# Patient Record
Sex: Female | Born: 1980 | Race: White | Hispanic: No | Marital: Married | Smoking: Former smoker
Health system: Southern US, Community
[De-identification: ages and names within clinical notes are randomized; demographics above are authoritative.]

## PROBLEM LIST (undated history)

## (undated) DIAGNOSIS — F32A Depression, unspecified: Secondary | ICD-10-CM

## (undated) DIAGNOSIS — F419 Anxiety disorder, unspecified: Secondary | ICD-10-CM

## (undated) DIAGNOSIS — T7840XA Allergy, unspecified, initial encounter: Secondary | ICD-10-CM

## (undated) DIAGNOSIS — J45909 Unspecified asthma, uncomplicated: Secondary | ICD-10-CM

## (undated) DIAGNOSIS — F329 Major depressive disorder, single episode, unspecified: Secondary | ICD-10-CM

## (undated) HISTORY — DX: Allergy, unspecified, initial encounter: T78.40XA

## (undated) HISTORY — DX: Unspecified asthma, uncomplicated: J45.909

## (undated) HISTORY — DX: Anxiety disorder, unspecified: F41.9

## (undated) HISTORY — DX: Major depressive disorder, single episode, unspecified: F32.9

## (undated) HISTORY — DX: Depression, unspecified: F32.A

---

## 2011-11-18 ENCOUNTER — Ambulatory Visit (INDEPENDENT_AMBULATORY_CARE_PROVIDER_SITE_OTHER): Payer: BC Managed Care – PPO

## 2011-11-18 DIAGNOSIS — F411 Generalized anxiety disorder: Secondary | ICD-10-CM

## 2011-11-18 DIAGNOSIS — Z23 Encounter for immunization: Secondary | ICD-10-CM

## 2011-11-18 DIAGNOSIS — S61209A Unspecified open wound of unspecified finger without damage to nail, initial encounter: Secondary | ICD-10-CM

## 2011-11-26 ENCOUNTER — Encounter (INDEPENDENT_AMBULATORY_CARE_PROVIDER_SITE_OTHER): Payer: BC Managed Care – PPO

## 2011-11-26 DIAGNOSIS — S61209A Unspecified open wound of unspecified finger without damage to nail, initial encounter: Secondary | ICD-10-CM

## 2015-05-13 ENCOUNTER — Ambulatory Visit (INDEPENDENT_AMBULATORY_CARE_PROVIDER_SITE_OTHER): Payer: BC Managed Care – PPO | Admitting: Physician Assistant

## 2015-05-13 VITALS — BP 120/80 | HR 84 | Temp 98.2°F | Resp 18 | Ht 65.25 in | Wt 196.5 lb

## 2015-05-13 DIAGNOSIS — J029 Acute pharyngitis, unspecified: Secondary | ICD-10-CM

## 2015-05-13 DIAGNOSIS — R0981 Nasal congestion: Secondary | ICD-10-CM | POA: Diagnosis not present

## 2015-05-13 MED ORDER — AZITHROMYCIN 250 MG PO TABS: ORAL_TABLET | ORAL | Status: DC

## 2015-05-13 NOTE — Patient Instructions (Signed)
Continue taking the sudafed and claritin as you've been.  For the sore throat, cycling between tylenol and ibuprofen every 4 hours will help. Lozenges may help.  Please take the azithro 2 tabs today, then one daily for 4 days.

## 2015-05-13 NOTE — Progress Notes (Signed)
   Subjective:    Patient ID: Mary Aguilar, female    DOB: 08/19/1981, 34 y.o.   MRN: [id number]  Chief Complaint  Patient presents with  . Sore Throat    x 5-7 days  . Cough  . Headache  . Adenopathy   Medications, allergies, past medical history, surgical history, family history, social history and problem list reviewed and updated.  HPI  [age] yof presents with above sx.   Sx started 7-8 days ago with head/nasal congestion. Started with st couple days later. Sx improved after few days but got worse again 2 days ago. Has been having mild cough past few days non prod. Low grade fevers at home per pt. She typically runs in 97s but has been running slightly higher at home. Feels neck LNs are swollen.   Taking claritin and sudafed daily with no relief.   Hx allergy to pcn was hives.   Review of Systems See HPI.     Objective:   Physical Exam  Constitutional:  BP 120/80 mmHg  Pulse 84  Temp(Src) 98.2 F (36.8 C) (Oral)  Resp 18  Ht 5' 5.25" (1.657 m)  Wt 196 lb 8 oz (89.132 kg)  BMI 32.46 kg/m2  SpO2 99%  LMP 05/12/2015 (Exact Date)   HENT:  Right Ear: A middle ear effusion is present.  Left Ear: A middle ear effusion is present.  Nose: No mucosal edema or rhinorrhea. Right sinus exhibits maxillary sinus tenderness. Right sinus exhibits no frontal sinus tenderness. Left sinus exhibits maxillary sinus tenderness. Left sinus exhibits no frontal sinus tenderness.  Mouth/Throat: Uvula is midline, oropharynx is clear and moist and mucous membranes are normal.  Mild bilateral sinus ttp  Pulmonary/Chest: Effort normal and breath sounds normal. No tachypnea.  Lymphadenopathy:       Head (right side): No submental, no submandibular and no tonsillar adenopathy present.       Head (left side): No submental, no submandibular and no tonsillar adenopathy present.    She has no cervical adenopathy.      Assessment & Plan:   Sore throat - Plan: azithromycin (ZITHROMAX) 250 MG  tablet  Sinus congestion - Plan: azithromycin (ZITHROMAX) 250 MG tablet --sx ongoing 7-8 days with slight worsening 2 days ago, low grade fevers at home --pt asking for zpack as heading to beach tomorrow for 9 days, given --continue claritin/sudafed, tylenol/ibuprofen cycling for st, lozenges for st --see someone at beach if no improvement 4-5 days  Donnajean Lopesodd M. Vernal Hritz, PA-C Physician Assistant-Certified [facility]  05/13/2015 8:44 PM

## 2015-07-30 ENCOUNTER — Ambulatory Visit (INDEPENDENT_AMBULATORY_CARE_PROVIDER_SITE_OTHER): Payer: BC Managed Care – PPO | Admitting: Family Medicine

## 2015-07-30 VITALS — BP 118/80 | HR 84 | Temp 98.8°F | Resp 16 | Ht 65.5 in | Wt 201.0 lb

## 2015-07-30 DIAGNOSIS — S134XXA Sprain of ligaments of cervical spine, initial encounter: Secondary | ICD-10-CM

## 2015-07-30 DIAGNOSIS — M545 Low back pain, unspecified: Secondary | ICD-10-CM

## 2015-07-30 DIAGNOSIS — M542 Cervicalgia: Secondary | ICD-10-CM | POA: Diagnosis not present
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
## 2015-07-30 NOTE — Patient Instructions (Signed)
Ibuprofen 600-800mg  three times a day with heat x 20 minutes for times a day.  If you are still having any pain or discomfort in 3 days, make sure you come back for further evaluation.  If you are having any pain shoot down your extremities or any burning, tingling, numbness, or weakness come back immediately. If you have any changes in your bowels or bladder or worsening HA, come back immediately.  Cervical Sprain A cervical sprain is an injury in the neck in which the strong, fibrous tissues (ligaments) that connect your neck bones stretch or tear. Cervical sprains can range from mild to severe. Severe cervical sprains can cause the neck vertebrae to be unstable. This can lead to damage of the spinal cord and can result in serious nervous system problems. The amount of time it takes for a cervical sprain to get better depends on the cause and extent of the injury. Most cervical sprains heal in 1 to 3 weeks. CAUSES  Severe cervical sprains may be caused by:   Contact sport injuries (such as from football, rugby, wrestling, hockey, auto racing, gymnastics, diving, martial arts, or boxing).   Motor vehicle collisions.   Whiplash injuries. This is an injury from a sudden forward and backward whipping movement of the head and neck.  Falls.  Mild cervical sprains may be caused by:   Being in an awkward position, such as while cradling a telephone between your ear and shoulder.   Sitting in a chair that does not offer proper support.   Working at a poorly Marketing executive station.   Looking up or down for long periods of time.  SYMPTOMS   Pain, soreness, stiffness, or a burning sensation in the front, back, or sides of the neck. This discomfort may develop immediately after the injury or slowly, 24 hours or more after the injury.   Pain or tenderness directly in the middle of the back of the neck.   Shoulder or upper back pain.   Limited ability to move the neck.   Headache.    Dizziness.   Weakness, numbness, or tingling in the hands or arms.   Muscle spasms.   Difficulty swallowing or chewing.   Tenderness and swelling of the neck.  DIAGNOSIS  Most of the time your health care provider can diagnose a cervical sprain by taking your history and doing a physical exam. Your health care provider will ask about previous neck injuries and any known neck problems, such as arthritis in the neck. X-rays may be taken to find out if there are any other problems, such as with the bones of the neck. Other tests, such as a CT scan or MRI, may also be needed.  TREATMENT  Treatment depends on the severity of the cervical sprain. Mild sprains can be treated with rest, keeping the neck in place (immobilization), and pain medicines. Severe cervical sprains are immediately immobilized. Further treatment is done to help with pain, muscle spasms, and other symptoms and may include:  Medicines, such as pain relievers, numbing medicines, or muscle relaxants.   Physical therapy. This may involve stretching exercises, strengthening exercises, and posture training. Exercises and improved posture can help stabilize the neck, strengthen muscles, and help stop symptoms from returning.  HOME CARE INSTRUCTIONS   Put ice on the injured area.   Put ice in a plastic bag.   Place a towel between your skin and the bag.   Leave the ice on for 15-20 minutes, 3-4 times a  day.   If your injury was severe, you may have been given a cervical collar to wear. A cervical collar is a two-piece collar designed to keep your neck from moving while it heals.  Do not remove the collar unless instructed by your health care provider.  If you have long hair, keep it outside of the collar.  Ask your health care provider before making any adjustments to your collar. Minor adjustments may be required over time to improve comfort and reduce pressure on your chin or on the back of your  head.  Ifyou are allowed to remove the collar for cleaning or bathing, follow your health care provider's instructions on how to do so safely.  Keep your collar clean by wiping it with mild soap and water and drying it completely. If the collar you have been given includes removable pads, remove them every 1-2 days and hand wash them with soap and water. Allow them to air dry. They should be completely dry before you wear them in the collar.  If you are allowed to remove the collar for cleaning and bathing, wash and dry the skin of your neck. Check your skin for irritation or sores. If you see any, tell your health care provider.  Do not drive while wearing the collar.   Only take over-the-counter or prescription medicines for pain, discomfort, or fever as directed by your health care provider.   Keep all follow-up appointments as directed by your health care provider.   Keep all physical therapy appointments as directed by your health care provider.   Make any needed adjustments to your workstation to promote good posture.   Avoid positions and activities that make your symptoms worse.   Warm up and stretch before being active to help prevent problems.  SEEK MEDICAL CARE IF:   Your pain is not controlled with medicine.   You are unable to decrease your pain medicine over time as planned.   Your activity level is not improving as expected.  SEEK IMMEDIATE MEDICAL CARE IF:   You develop any bleeding.  You develop stomach upset.  You have signs of an allergic reaction to your medicine.   Your symptoms get worse.   You develop new, unexplained symptoms.   You have numbness, tingling, weakness, or paralysis in any part of your body.  MAKE SURE YOU:   Understand these instructions.  Will watch your condition.  Will get help right away if you are not doing well or get worse. Document Released: 08/13/2007 Document Revised: 10/21/2013 Document Reviewed:  04/23/2013 [employer] Patient Information 2015 [place], [place]. This information is not intended to replace advice given to you by your health care provider. Make sure you discuss any questions you have with your health care provider.  Back Exercises Back exercises help treat and prevent back injuries. The goal of back exercises is to increase the strength of your abdominal and back muscles and the flexibility of your back. These exercises should be started when you no longer have back pain. Back exercises include:  Pelvic Tilt. Lie on your back with your knees bent. Tilt your pelvis until the lower part of your back is against the floor. Hold this position 5 to 10 sec and repeat 5 to 10 times.  Knee to Chest. Pull first 1 knee up against your chest and hold for 20 to 30 seconds, repeat this with the other knee, and then both knees. This may be done with the other leg straight or  bent, whichever feels better.  Sit-Ups or Curl-Ups. Bend your knees 90 degrees. Start with tilting your pelvis, and do a partial, slow sit-up, lifting your trunk only 30 to 45 degrees off the floor. Take at least 2 to 3 seconds for each sit-up. Do not do sit-ups with your knees out straight. If partial sit-ups are difficult, simply do the above but with only tightening your abdominal muscles and holding it as directed.  Hip-Lift. Lie on your back with your knees flexed 90 degrees. Push down with your feet and shoulders as you raise your hips a couple inches off the floor; hold for 10 seconds, repeat 5 to 10 times.  Back arches. Lie on your stomach, propping yourself up on bent elbows. Slowly press on your hands, causing an arch in your low back. Repeat 3 to 5 times. Any initial stiffness and discomfort should lessen with repetition over time.  Shoulder-Lifts. Lie face down with arms beside your body. Keep hips and torso pressed to floor as you slowly lift your head and shoulders off the floor. Do not overdo your exercises,  especially in the beginning. Exercises may cause you some mild back discomfort which lasts for a few minutes; however, if the pain is more severe, or lasts for more than 15 minutes, do not continue exercises until you see your caregiver. Improvement with exercise therapy for back problems is slow.  See your caregivers for assistance with developing a proper back exercise program. Document Released: 11/23/2004 Document Revised: 01/08/2012 Document Reviewed: 08/17/2011 [facility] Patient Information 2015 [place], [place]. This information is not intended to replace advice given to you by your health care provider. Make sure you discuss any questions you have with your health care provider.  Low Back Strain with Rehab A strain is an injury in which a tendon or muscle is torn. The muscles and tendons of the lower back are vulnerable to strains. However, these muscles and tendons are very strong and require a great force to be injured. Strains are classified into three categories. Grade 1 strains cause pain, but the tendon is not lengthened. Grade 2 strains include a lengthened ligament, due to the ligament being stretched or partially ruptured. With grade 2 strains there is still function, although the function may be decreased. Grade 3 strains involve a complete tear of the tendon or muscle, and function is usually impaired. SYMPTOMS   Pain in the lower back.  Pain that affects one side more than the other.  Pain that gets worse with movement and may be felt in the hip, buttocks, or back of the thigh.  Muscle spasms of the muscles in the back.  Swelling along the muscles of the back.  Loss of strength of the back muscles.  Crackling sound (crepitation) when the muscles are touched. CAUSES  Lower back strains occur when a force is placed on the muscles or tendons that is greater than they can handle. Common causes of injury include:  Prolonged overuse of the muscle-tendon units in the lower back,  usually from incorrect posture.  A single violent injury or force applied to the back. RISK INCREASES WITH:  Sports that involve twisting forces on the spine or a lot of bending at the waist (football, rugby, weightlifting, bowling, golf, tennis, speed skating, racquetball, swimming, running, gymnastics, diving).  Poor strength and flexibility.  Failure to warm up properly before activity.  Family history of lower back pain or disk disorders.  Previous back injury or surgery (especially fusion).  Poor  posture with lifting, especially heavy objects.  Prolonged sitting, especially with poor posture. PREVENTION   Learn and use proper posture when sitting or lifting (maintain proper posture when sitting, lift using the knees and legs, not at the waist).  Warm up and stretch properly before activity.  Allow for adequate recovery between workouts.  Maintain physical fitness:  Strength, flexibility, and endurance.  Cardiovascular fitness. PROGNOSIS  If treated properly, lower back strains usually heal within 6 weeks. RELATED COMPLICATIONS   Recurring symptoms, resulting in a chronic problem.  Chronic inflammation, scarring, and partial muscle-tendon tear.  Delayed healing or resolution of symptoms.  Prolonged disability. TREATMENT  Treatment first involves the use of ice and medicine, to reduce pain and inflammation. The use of strengthening and stretching exercises may help reduce pain with activity. These exercises may be performed at home or with a therapist. Severe injuries may require referral to a therapist for further evaluation and treatment, such as ultrasound. Your caregiver may advise that you wear a back brace or corset, to help reduce pain and discomfort. Often, prolonged bed rest results in greater harm then benefit. Corticosteroid injections may be recommended. However, these should be reserved for the most serious cases. It is important to avoid using your back  when lifting objects. At night, sleep on your back on a firm mattress with a pillow placed under your knees. If non-surgical treatment is unsuccessful, surgery may be needed.  MEDICATION   If pain medicine is needed, nonsteroidal anti-inflammatory medicines (aspirin and ibuprofen), or other minor pain relievers (acetaminophen), are often advised.  Do not take pain medicine for 7 days before surgery.  Prescription pain relievers may be given, if your caregiver thinks they are needed. Use only as directed and only as much as you need.  Ointments applied to the skin may be helpful.  Corticosteroid injections may be given by your caregiver. These injections should be reserved for the most serious cases, because they may only be given a certain number of times. HEAT AND COLD  Cold treatment (icing) should be applied for 10 to 15 minutes every 2 to 3 hours for inflammation and pain, and immediately after activity that aggravates your symptoms. Use ice packs or an ice massage.  Heat treatment may be used before performing stretching and strengthening activities prescribed by your caregiver, physical therapist, or athletic trainer. Use a heat pack or a warm water soak. SEEK MEDICAL CARE IF:   Symptoms get worse or do not improve in 2 to 4 weeks, despite treatment.  You develop numbness, weakness, or loss of bowel or bladder function.  New, unexplained symptoms develop. (Drugs used in treatment may produce side effects.) EXERCISES  RANGE OF MOTION (ROM) AND STRETCHING EXERCISES - Low Back Strain Most people with lower back pain will find that their symptoms get worse with excessive bending forward (flexion) or arching at the lower back (extension). The exercises which will help resolve your symptoms will focus on the opposite motion.  Your physician, physical therapist or athletic trainer will help you determine which exercises will be most helpful to resolve your lower back pain. Do not complete  any exercises without first consulting with your caregiver. Discontinue any exercises which make your symptoms worse until you speak to your caregiver.  If you have pain, numbness or tingling which travels down into your buttocks, leg or foot, the goal of the therapy is for these symptoms to move closer to your back and eventually resolve. Sometimes, these leg symptoms  will get better, but your lower back pain may worsen. This is typically an indication of progress in your rehabilitation. Be very alert to any changes in your symptoms and the activities in which you participated in the 24 hours prior to the change. Sharing this information with your caregiver will allow him/her to most efficiently treat your condition.  These exercises may help you when beginning to rehabilitate your injury. Your symptoms may resolve with or without further involvement from your physician, physical therapist or athletic trainer. While completing these exercises, remember:  Restoring tissue flexibility helps normal motion to return to the joints. This allows healthier, less painful movement and activity.  An effective stretch should be held for at least 30 seconds.  A stretch should never be painful. You should only feel a gentle lengthening or release in the stretched tissue. FLEXION RANGE OF MOTION AND STRETCHING EXERCISES: STRETCH - Flexion, Single Knee to Chest   Lie on a firm bed or floor with both legs extended in front of you.  Keeping one leg in contact with the floor, bring your opposite knee to your chest. Hold your leg in place by either grabbing behind your thigh or at your knee.  Pull until you feel a gentle stretch in your lower back. Hold __________ seconds.  Slowly release your grasp and repeat the exercise with the opposite side. Repeat __________ times. Complete this exercise __________ times per day.  STRETCH - Flexion, Double Knee to Chest   Lie on a firm bed or floor with both legs extended  in front of you.  Keeping one leg in contact with the floor, bring your opposite knee to your chest.  Tense your stomach muscles to support your back and then lift your other knee to your chest. Hold your legs in place by either grabbing behind your thighs or at your knees.  Pull both knees toward your chest until you feel a gentle stretch in your lower back. Hold __________ seconds.  Tense your stomach muscles and slowly return one leg at a time to the floor. Repeat __________ times. Complete this exercise __________ times per day.  STRETCH - Low Trunk Rotation  Lie on a firm bed or floor. Keeping your legs in front of you, bend your knees so they are both pointed toward the ceiling and your feet are flat on the floor.  Extend your arms out to the side. This will stabilize your upper body by keeping your shoulders in contact with the floor.  Gently and slowly drop both knees together to one side until you feel a gentle stretch in your lower back. Hold for __________ seconds.  Tense your stomach muscles to support your lower back as you bring your knees back to the starting position. Repeat the exercise to the other side. Repeat __________ times. Complete this exercise __________ times per day  EXTENSION RANGE OF MOTION AND FLEXIBILITY EXERCISES: STRETCH - Extension, Prone on Elbows   Lie on your stomach on the floor, a bed will be too soft. Place your palms about shoulder width apart and at the height of your head.  Place your elbows under your shoulders. If this is too painful, stack pillows under your chest.  Allow your body to relax so that your hips drop lower and make contact more completely with the floor.  Hold this position for __________ seconds.  Slowly return to lying flat on the floor. Repeat __________ times. Complete this exercise __________ times per day.  RANGE OF  MOTION - Extension, Prone Press Ups  Lie on your stomach on the floor, a bed will be too soft. Place  your palms about shoulder width apart and at the height of your head.  Keeping your back as relaxed as possible, slowly straighten your elbows while keeping your hips on the floor. You may adjust the placement of your hands to maximize your comfort. As you gain motion, your hands will come more underneath your shoulders.  Hold this position __________ seconds.  Slowly return to lying flat on the floor. Repeat __________ times. Complete this exercise __________ times per day.  RANGE OF MOTION- Quadruped, Neutral Spine   Assume a hands and knees position on a firm surface. Keep your hands under your shoulders and your knees under your hips. You may place padding under your knees for comfort.  Drop your head and point your tail bone toward the ground below you. This will round out your lower back like an angry cat. Hold this position for __________ seconds.  Slowly lift your head and release your tail bone so that your back sags into a large arch, like an old horse.  Hold this position for __________ seconds.  Repeat this until you feel limber in your lower back.  Now, find your "sweet spot." This will be the most comfortable position somewhere between the two previous positions. This is your neutral spine. Once you have found this position, tense your stomach muscles to support your lower back.  Hold this position for __________ seconds. Repeat __________ times. Complete this exercise __________ times per day.  STRENGTHENING EXERCISES - Low Back Strain These exercises may help you when beginning to rehabilitate your injury. These exercises should be done near your "sweet spot." This is the neutral, low-back arch, somewhere between fully rounded and fully arched, that is your least painful position. When performed in this safe range of motion, these exercises can be used for people who have either a flexion or extension based injury. These exercises may resolve your symptoms with or without  further involvement from your physician, physical therapist or athletic trainer. While completing these exercises, remember:   Muscles can gain both the endurance and the strength needed for everyday activities through controlled exercises.  Complete these exercises as instructed by your physician, physical therapist or athletic trainer. Increase the resistance and repetitions only as guided.  You may experience muscle soreness or fatigue, but the pain or discomfort you are trying to eliminate should never worsen during these exercises. If this pain does worsen, stop and make certain you are following the directions exactly. If the pain is still present after adjustments, discontinue the exercise until you can discuss the trouble with your caregiver. STRENGTHENING - Deep Abdominals, Pelvic Tilt  Lie on a firm bed or floor. Keeping your legs in front of you, bend your knees so they are both pointed toward the ceiling and your feet are flat on the floor.  Tense your lower abdominal muscles to press your lower back into the floor. This motion will rotate your pelvis so that your tail bone is scooping upwards rather than pointing at your feet or into the floor.  With a gentle tension and even breathing, hold this position for __________ seconds. Repeat __________ times. Complete this exercise __________ times per day.  STRENGTHENING - Abdominals, Crunches   Lie on a firm bed or floor. Keeping your legs in front of you, bend your knees so they are both pointed toward the ceiling and  your feet are flat on the floor. Cross your arms over your chest.  Slightly tip your chin down without bending your neck.  Tense your abdominals and slowly lift your trunk high enough to just clear your shoulder blades. Lifting higher can put excessive stress on the lower back and does not further strengthen your abdominal muscles.  Control your return to the starting position. Repeat __________ times. Complete this  exercise __________ times per day.  STRENGTHENING - Quadruped, Opposite UE/LE Lift   Assume a hands and knees position on a firm surface. Keep your hands under your shoulders and your knees under your hips. You may place padding under your knees for comfort.  Find your neutral spine and gently tense your abdominal muscles so that you can maintain this position. Your shoulders and hips should form a rectangle that is parallel with the floor and is not twisted.  Keeping your trunk steady, lift your right hand no higher than your shoulder and then your left leg no higher than your hip. Make sure you are not holding your breath. Hold this position __________ seconds.  Continuing to keep your abdominal muscles tense and your back steady, slowly return to your starting position. Repeat with the opposite arm and leg. Repeat __________ times. Complete this exercise __________ times per day.  STRENGTHENING - Lower Abdominals, Double Knee Lift  Lie on a firm bed or floor. Keeping your legs in front of you, bend your knees so they are both pointed toward the ceiling and your feet are flat on the floor.  Tense your abdominal muscles to brace your lower back and slowly lift both of your knees until they come over your hips. Be certain not to hold your breath.  Hold __________ seconds. Using your abdominal muscles, return to the starting position in a slow and controlled manner. Repeat __________ times. Complete this exercise __________ times per day.  POSTURE AND BODY MECHANICS CONSIDERATIONS - Low Back Strain Keeping correct posture when sitting, standing or completing your activities will reduce the stress put on different body tissues, allowing injured tissues a chance to heal and limiting painful experiences. The following are general guidelines for improved posture. Your physician or physical therapist will provide you with any instructions specific to your needs. While reading these guidelines,  remember:  The exercises prescribed by your provider will help you have the flexibility and strength to maintain correct postures.  The correct posture provides the best environment for your joints to work. All of your joints have less wear and tear when properly supported by a spine with good posture. This means you will experience a healthier, less painful body.  Correct posture must be practiced with all of your activities, especially prolonged sitting and standing. Correct posture is as important when doing repetitive low-stress activities (typing) as it is when doing a single heavy-load activity (lifting). RESTING POSITIONS Consider which positions are most painful for you when choosing a resting position. If you have pain with flexion-based activities (sitting, bending, stooping, squatting), choose a position that allows you to rest in a less flexed posture. You would want to avoid curling into a fetal position on your side. If your pain worsens with extension-based activities (prolonged standing, working overhead), avoid resting in an extended position such as sleeping on your stomach. Most people will find more comfort when they rest with their spine in a more neutral position, neither too rounded nor too arched. Lying on a non-sagging bed on your side with a  pillow between your knees, or on your back with a pillow under your knees will often provide some relief. Keep in mind, being in any one position for a prolonged period of time, no matter how correct your posture, can still lead to stiffness. PROPER SITTING POSTURE In order to minimize stress and discomfort on your spine, you must sit with correct posture. Sitting with good posture should be effortless for a healthy body. Returning to good posture is a gradual process. Many people can work toward this most comfortably by using various supports until they have the flexibility and strength to maintain this posture on their own. When sitting  with proper posture, your ears will fall over your shoulders and your shoulders will fall over your hips. You should use the back of the chair to support your upper back. Your lower back will be in a neutral position, just slightly arched. You may place a small pillow or folded towel at the base of your lower back for support.  When working at a desk, create an environment that supports good, upright posture. Without extra support, muscles tire, which leads to excessive strain on joints and other tissues. Keep these recommendations in mind: CHAIR:  A chair should be able to slide under your desk when your back makes contact with the back of the chair. This allows you to work closely.  The chair's height should allow your eyes to be level with the upper part of your monitor and your hands to be slightly lower than your elbows. BODY POSITION  Your feet should make contact with the floor. If this is not possible, use a foot rest.  Keep your ears over your shoulders. This will reduce stress on your neck and lower back. INCORRECT SITTING POSTURES  If you are feeling tired and unable to assume a healthy sitting posture, do not slouch or slump. This puts excessive strain on your back tissues, causing more damage and pain. Healthier options include:  Using more support, like a lumbar pillow.  Switching tasks to something that requires you to be upright or walking.  Talking a brief walk.  Lying down to rest in a neutral-spine position. PROLONGED STANDING WHILE SLIGHTLY LEANING FORWARD  When completing a task that requires you to lean forward while standing in one place for a long time, place either foot up on a stationary 2-4 inch high object to help maintain the best posture. When both feet are on the ground, the lower back tends to lose its slight inward curve. If this curve flattens (or becomes too large), then the back and your other joints will experience too much stress, tire more quickly, and  can cause pain. CORRECT STANDING POSTURES Proper standing posture should be assumed with all daily activities, even if they only take a few moments, like when brushing your teeth. As in sitting, your ears should fall over your shoulders and your shoulders should fall over your hips. You should keep a slight tension in your abdominal muscles to brace your spine. Your tailbone should point down to the ground, not behind your body, resulting in an over-extended swayback posture.  INCORRECT STANDING POSTURES  Common incorrect standing postures include a forward head, locked knees and/or an excessive swayback. WALKING Walk with an upright posture. Your ears, shoulders and hips should all line-up. PROLONGED ACTIVITY IN A FLEXED POSITION When completing a task that requires you to bend forward at your waist or lean over a low surface, try to find a way  to stabilize 3 out of 4 of your limbs. You can place a hand or elbow on your thigh or rest a knee on the surface you are reaching across. This will provide you more stability so that your muscles do not fatigue as quickly. By keeping your knees relaxed, or slightly bent, you will also reduce stress across your lower back. CORRECT LIFTING TECHNIQUES DO :   Assume a wide stance. This will provide you more stability and the opportunity to get as close as possible to the object which you are lifting.  Tense your abdominals to brace your spine. Bend at the knees and hips. Keeping your back locked in a neutral-spine position, lift using your leg muscles. Lift with your legs, keeping your back straight.  Test the weight of unknown objects before attempting to lift them.  Try to keep your elbows locked down at your sides in order get the best strength from your shoulders when carrying an object.  Always ask for help when lifting heavy or awkward objects. INCORRECT LIFTING TECHNIQUES DO NOT:   Lock your knees when lifting, even if it is a small object.  Bend  and twist. Pivot at your feet or move your feet when needing to change directions.  Assume that you can safely pick up even a paper clip without proper posture. Document Released: 10/16/2005 Document Revised: 01/08/2012 Document Reviewed: 01/28/2009 [facility] Patient Information 2015 [place], [place]. This information is not intended to replace advice given to you by your health care provider. Make sure you discuss any questions you have with your health care provider.

## 2016-02-28 ENCOUNTER — Other Ambulatory Visit: Payer: Self-pay | Admitting: Physician Assistant

## 2016-02-28 ENCOUNTER — Ambulatory Visit
Admission: RE | Admit: 2016-02-28 | Discharge: 2016-02-28 | Disposition: A | Discharge status: Home / Self Care | Payer: Self-pay | Source: Ambulatory Visit | Attending: Physician Assistant | Admitting: Physician Assistant

## 2016-02-28 DIAGNOSIS — R52 Pain, unspecified: Secondary | ICD-10-CM

## 2016-09-05 ENCOUNTER — Emergency Department (HOSPITAL_COMMUNITY): Payer: BC Managed Care – PPO

## 2016-09-05 ENCOUNTER — Emergency Department (HOSPITAL_COMMUNITY)
Admission: EM | Admit: 2016-09-05 | Discharge: 2016-09-05 | Disposition: A | Discharge status: Home / Self Care | Payer: BC Managed Care – PPO | Attending: Emergency Medicine | Admitting: Emergency Medicine

## 2016-09-05 ENCOUNTER — Encounter (HOSPITAL_COMMUNITY): Payer: Self-pay | Admitting: Emergency Medicine

## 2016-09-05 DIAGNOSIS — Z23 Encounter for immunization: Secondary | ICD-10-CM | POA: Insufficient documentation

## 2016-09-05 DIAGNOSIS — Y939 Activity, unspecified: Secondary | ICD-10-CM | POA: Insufficient documentation

## 2016-09-05 DIAGNOSIS — S060X1A Concussion with loss of consciousness of 30 minutes or less, initial encounter: Secondary | ICD-10-CM | POA: Insufficient documentation

## 2016-09-05 DIAGNOSIS — S0181XA Laceration without foreign body of other part of head, initial encounter: Secondary | ICD-10-CM

## 2016-09-05 DIAGNOSIS — Y92410 Unspecified street and highway as the place of occurrence of the external cause: Secondary | ICD-10-CM | POA: Diagnosis not present

## 2016-09-05 DIAGNOSIS — S0990XA Unspecified injury of head, initial encounter: Secondary | ICD-10-CM | POA: Diagnosis present

## 2016-09-05 DIAGNOSIS — Y999 Unspecified external cause status: Secondary | ICD-10-CM | POA: Diagnosis not present

## 2016-09-05 LAB — COMPREHENSIVE METABOLIC PANEL
ALBUMIN: 3.9 g/dL (ref 3.5–5.0)
ALT: 32 U/L (ref 14–54)
AST: 29 U/L (ref 15–41)
Alkaline Phosphatase: 63 U/L (ref 38–126)
Anion gap: 10 (ref 5–15)
BUN: 10 mg/dL (ref 6–20)
CHLORIDE: 102 mmol/L (ref 101–111)
CO2: 23 mmol/L (ref 22–32)
CREATININE: 0.75 mg/dL (ref 0.44–1.00)
Calcium: 9.1 mg/dL (ref 8.9–10.3)
GFR calc Af Amer: >60 mL/min (ref >60)
GLUCOSE: 102 mg/dL — AB (ref 65–99)
POTASSIUM: 3.2 mmol/L — AB (ref 3.5–5.1)
SODIUM: 135 mmol/L (ref 135–145)
Total Bilirubin: 0.3 mg/dL (ref 0.3–1.2)
Total Protein: 7.2 g/dL (ref 6.5–8.1)

## 2016-09-05 LAB — CBC
HCT: 38.4 % (ref 36.0–46.0)
Hemoglobin: 12.7 g/dL (ref 12.0–15.0)
MCH: 29.1 pg (ref 26.0–34.0)
MCHC: 33.1 g/dL (ref 30.0–36.0)
MCV: 88.1 fL (ref 78.0–100.0)
PLATELETS: 266 10*3/uL (ref 150–400)
RBC: 4.36 MIL/uL (ref 3.87–5.11)
RDW: 12.7 % (ref 11.5–15.5)
WBC: 9.2 10*3/uL (ref 4.0–10.5)

## 2016-09-05 LAB — I-STAT BETA HCG BLOOD, ED (MC, WL, AP ONLY): I-stat hCG, quantitative: <5 m[IU]/mL (ref <5)

## 2016-09-05 LAB — LIPASE, BLOOD: Lipase: 29 U/L (ref 11–51)

## 2016-09-05 MED ORDER — LIDOCAINE-EPINEPHRINE (PF) 2 %-1:200000 IJ SOLN
INTRAMUSCULAR | Status: AC
  Administered 2016-09-05: 10 mL via INTRADERMAL
  Filled 2016-09-05: qty 10

## 2016-09-05 MED ORDER — TETANUS-DIPHTH-ACELL PERTUSSIS 5-2.5-18.5 LF-MCG/0.5 IM SUSP
0.5000 mL | Freq: Once | INTRAMUSCULAR | Status: AC
  Administered 2016-09-05: 0.5 mL via INTRAMUSCULAR
  Filled 2016-09-05: qty 0.5

## 2016-09-05 MED ORDER — IBUPROFEN 200 MG PO TABS
600.0000 mg | ORAL_TABLET | Freq: Once | ORAL | Status: AC
  Administered 2016-09-05: 600 mg via ORAL
  Filled 2016-09-05: qty 1

## 2016-09-05 MED ORDER — LIDOCAINE-EPINEPHRINE (PF) 2 %-1:200000 IJ SOLN
10.0000 mL | Freq: Once | INTRAMUSCULAR | Status: AC
  Administered 2016-09-05: 10 mL via INTRADERMAL

## 2016-09-05 NOTE — ED Notes (Signed)
Pt wanted to walk out and she said she felt fine.
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
## 2016-09-05 NOTE — ED Notes (Signed)
Family at bedside. 

## 2016-09-05 NOTE — ED Notes (Signed)
Patient transported to CT 

## 2016-09-05 NOTE — ED Notes (Signed)
Provided pt with ginger ale and requested urine sample.
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
## 2016-09-05 NOTE — Progress Notes (Signed)
Orthopedic Tech Progress Note Patient Details:  Mary ClementJessica Aguilar 02-20-1981 [id number]  Patient ID: Mary ClementJessica Aguilar, female   DOB: 02-20-1981, 35 y.o.   MRN: [id number]   [name], [name] 09/05/2016, 4:36 PM Made level 2 trauma visit

## 2016-09-05 NOTE — ED Notes (Signed)
Suture cart at bedside. MD setting up to suture patient's head laceration. Family at bedside.

## 2016-09-05 NOTE — Discharge Instructions (Signed)
Your forehead stitches should fall out on their own in 1-2 weeks. Apply antibiotic ointment or Vaseline to the wound twice a day for the first two weeks. Do not submerge your head in water until the wound is healed. Once it is healed, you should apply sunscreen to the area daily to help prevent scar formation. Come back to the ER or your doctor for any signs of infection including redness, pus, worsening swelling, or any other concerns.

## 2016-09-05 NOTE — ED Notes (Signed)
Pt verbalized understanding discharge instructions and denies any further needs or questions at this time. VS stable, ambulatory and steady gait.   

## 2016-09-22 ENCOUNTER — Ambulatory Visit (INDEPENDENT_AMBULATORY_CARE_PROVIDER_SITE_OTHER): Payer: BC Managed Care – PPO | Admitting: Physician Assistant

## 2016-09-22 VITALS — BP 126/88 | HR 88 | Temp 98.4°F | Resp 16 | Ht 66.0 in | Wt 210.2 lb

## 2016-09-22 DIAGNOSIS — Z111 Encounter for screening for respiratory tuberculosis: Secondary | ICD-10-CM | POA: Diagnosis not present

## 2016-09-22 DIAGNOSIS — Z13220 Encounter for screening for lipoid disorders: Secondary | ICD-10-CM | POA: Diagnosis not present

## 2016-09-22 DIAGNOSIS — Z1329 Encounter for screening for other suspected endocrine disorder: Secondary | ICD-10-CM | POA: Diagnosis not present

## 2016-09-22 DIAGNOSIS — Z0000 Encounter for general adult medical examination without abnormal findings: Secondary | ICD-10-CM | POA: Diagnosis not present

## 2016-09-22 DIAGNOSIS — Z130 Encounter for screening for diseases of the blood and blood-forming organs and certain disorders involving the immune mechanism: Secondary | ICD-10-CM

## 2016-09-22 DIAGNOSIS — Z114 Encounter for screening for human immunodeficiency virus [HIV]: Secondary | ICD-10-CM | POA: Diagnosis not present

## 2016-09-22 DIAGNOSIS — Z1389 Encounter for screening for other disorder: Secondary | ICD-10-CM

## 2016-09-22 DIAGNOSIS — Z1159 Encounter for screening for other viral diseases: Secondary | ICD-10-CM

## 2016-09-22 DIAGNOSIS — Z0184 Encounter for antibody response examination: Secondary | ICD-10-CM

## 2016-09-22 DIAGNOSIS — Z131 Encounter for screening for diabetes mellitus: Secondary | ICD-10-CM

## 2016-09-22 LAB — COMPLETE METABOLIC PANEL WITH GFR
ALT: 27 U/L (ref 6–29)
AST: 21 U/L (ref 10–30)
Albumin: 4.1 g/dL (ref 3.6–5.1)
Alkaline Phosphatase: 66 U/L (ref 33–115)
BUN: 12 mg/dL (ref 7–25)
CALCIUM: 9.2 mg/dL (ref 8.6–10.2)
CHLORIDE: 102 mmol/L (ref 98–110)
CO2: 22 mmol/L (ref 20–31)
Creat: 0.71 mg/dL (ref 0.50–1.10)
GFR, Est Non African American: >89 mL/min (ref >=60)
Glucose, Bld: 87 mg/dL (ref 65–99)
POTASSIUM: 4.5 mmol/L (ref 3.5–5.3)
Sodium: 137 mmol/L (ref 135–146)
Total Bilirubin: 0.4 mg/dL (ref 0.2–1.2)
Total Protein: 7.2 g/dL (ref 6.1–8.1)

## 2016-09-22 LAB — LIPID PANEL
CHOL/HDL RATIO: 3.3 ratio (ref <5.0)
Cholesterol: 202 mg/dL — ABNORMAL HIGH (ref <200)
HDL: 61 mg/dL (ref >50)
LDL Cholesterol: 116 mg/dL — ABNORMAL HIGH (ref <100)
TRIGLYCERIDES: 126 mg/dL (ref <150)
VLDL: 25 mg/dL (ref <30)

## 2016-09-22 LAB — CBC
HEMATOCRIT: 39.3 % (ref 35.0–45.0)
Hemoglobin: 12.9 g/dL (ref 11.7–15.5)
MCH: 29.1 pg (ref 27.0–33.0)
MCHC: 32.8 g/dL (ref 32.0–36.0)
MCV: 88.7 fL (ref 80.0–100.0)
MPV: 10.4 fL (ref 7.5–12.5)
PLATELETS: 320 10*3/uL (ref 140–400)
RBC: 4.43 MIL/uL (ref 3.80–5.10)
RDW: 13.8 % (ref 11.0–15.0)
WBC: 8.3 10*3/uL (ref 3.8–10.8)

## 2016-09-22 LAB — TSH: TSH: 2.12 mIU/L

## 2016-09-22 NOTE — Patient Instructions (Signed)
     IF you received an x-ray today, you will receive an invoice from [facility]. Please contact [facility] at [phone] with questions or concerns regarding your invoice.   IF you received labwork today, you will receive an invoice from [facility]. Please contact [facility] at [phone] with questions or concerns regarding your invoice.   Our billing staff will not be able to assist you with questions regarding bills from these companies.  You will be contacted with the lab results as soon as they are available. The fastest way to get your results is to activate your My Chart account. Instructions are located on the last page of this paperwork. If you have not heard from us regarding the results in 2 weeks, please contact this office.      

## 2016-09-22 NOTE — Progress Notes (Signed)
09/26/2016 9:17 AM   DOB: 11-11-1980 / MRN: [id number]  SUBJECTIVE:  Mary Aguilar is a 35 y.o. female presenting for a physical. She exercises via walking and uses a pedometer and gets about 20,000 steps a day. Does not smoke.  Has a wife and is happily married.  Needs a TB screen today for work. Feels well, has no complaints.  Is being screened for GYN problems yearly at her GYN and wants to continue this.   Depression screen PHQ 2/9 09/22/2016  Decreased Interest 0  Down, Depressed, Hopeless 0  PHQ - 2 Score 0     Immunization History  Administered Date(s) Administered  . Influenza-Unspecified 09/04/2016  . PPD Test 09/23/2016  . Tdap 09/05/2016     She is allergic to penicillins and sulfa antibiotics.   She  has a past medical history of Allergy; Anxiety; Asthma; and Depression.    She  reports that she has never smoked. She has never used smokeless tobacco. She reports that she drinks alcohol. She reports that she does not use drugs. She  has no sexual activity history on file. The patient  has no past surgical history on file.  Her family history includes Cancer in her maternal grandfather and paternal grandfather; Heart disease in her paternal grandmother; Hyperlipidemia in her maternal grandmother; Hypertension in her maternal grandmother and mother.  Review of Systems  Constitutional: Negative for chills and fever.  Cardiovascular: Negative for chest pain.  Skin: Negative for itching and rash.  Neurological: Negative for dizziness.    The problem list and medications were reviewed and updated by myself where necessary and exist elsewhere in the encounter.   OBJECTIVE:  BP 126/88 (BP Location: Right Arm, Patient Position: Sitting, Cuff Size: Small)   Pulse 88   Temp 98.4 F (36.9 C) (Oral)   Resp 16   Ht 5\' 6"  (1.676 m)   Wt 210 lb 3.2 oz (95.3 kg)   LMP 08/22/2016   SpO2 100%   BMI 33.93 kg/m     Physical Exam  Constitutional: She is oriented to  person, place, and time. She appears well-developed and well-nourished. No distress.  HENT:  Mouth/Throat: No oropharyngeal exudate.  Cardiovascular: Normal rate and regular rhythm.   Pulmonary/Chest: Effort normal and breath sounds normal.  Abdominal: Soft.  Musculoskeletal: Normal range of motion.  Neurological: She is alert and oriented to person, place, and time. No cranial nerve deficit. Coordination normal.  Skin: Skin is warm and dry. She is not diaphoretic.  Psychiatric: She has a normal mood and affect.    No results found for this or any previous visit (from the past 72 hour(s)).  No results found.  ASSESSMENT AND PLAN  Mary Aguilar was seen today for annual exam.  Diagnoses and all orders for this visit:  Encounter for annual physical exam  Screening-pulmonary TB -     Cancel: PPD  Screening for deficiency anemia -     CBC  Screening for nephropathy -     COMPLETE METABOLIC PANEL WITH GFR  Screening for lipid disorders -     Lipid panel  Screening for thyroid disorder -     TSH  Screening for diabetes mellitus -     Hemoglobin A1c  Screening for HIV (human immunodeficiency virus) -     HIV antibody  Need for hepatitis B screening test: will start series.  -     Hepatitis B surface antibody -     Hepatitis B surface antigen  Antibody response exam: Will boost immunity.  -     Measles/Mumps/Rubella Immunity    The patient is advised to call or return to clinic if she does not see an improvement in symptoms, or to seek the care of the closest emergency department if she worsens with the above plan.   Deliah BostonMichael Ameilia Rattan, MHS, PA-C [facility] 09/26/2016 9:17 AM

## 2016-09-23 ENCOUNTER — Ambulatory Visit (INDEPENDENT_AMBULATORY_CARE_PROVIDER_SITE_OTHER): Payer: BC Managed Care – PPO | Admitting: Physician Assistant

## 2016-09-23 DIAGNOSIS — Z0000 Encounter for general adult medical examination without abnormal findings: Secondary | ICD-10-CM | POA: Diagnosis not present

## 2016-09-23 LAB — HEPATITIS B SURFACE ANTIBODY, QUANTITATIVE

## 2016-09-23 LAB — HEPATITIS B SURFACE ANTIGEN: HEP B S AG: NEGATIVE

## 2016-09-23 LAB — HEMOGLOBIN A1C
Hgb A1c MFr Bld: 4.9 % (ref <5.7)
Mean Plasma Glucose: 94 mg/dL

## 2016-09-23 LAB — HIV ANTIBODY (ROUTINE TESTING W REFLEX): HIV 1&2 Ab, 4th Generation: NONREACTIVE

## 2016-09-23 NOTE — Progress Notes (Signed)
Pt here for tb test today. Feeling well. Given in left inner forearm. [id number] 09/14/17 exp.

## 2016-09-25 ENCOUNTER — Ambulatory Visit (INDEPENDENT_AMBULATORY_CARE_PROVIDER_SITE_OTHER): Payer: BC Managed Care – PPO | Admitting: Physician Assistant

## 2016-09-25 DIAGNOSIS — Z111 Encounter for screening for respiratory tuberculosis: Secondary | ICD-10-CM

## 2016-09-25 LAB — MEASLES/MUMPS/RUBELLA IMMUNITY
Mumps IgG: 64.5 AU/mL — ABNORMAL HIGH (ref <9.00)
RUBELLA: 2.03 {index} — AB (ref <0.90)
Rubeola IgG: <25 AU/mL (ref <25.00)

## 2016-09-25 LAB — TB SKIN TEST
Induration: 0 mm
TB Skin Test: NEGATIVE

## 2016-09-26 ENCOUNTER — Telehealth: Payer: Self-pay | Admitting: Emergency Medicine

## 2016-09-26 NOTE — Progress Notes (Signed)
Patient called by Ms. Wayne Sever RN.  She needs an MMR and to start the hepatitis B series.

## 2016-09-26 NOTE — Telephone Encounter (Signed)
Left message to schedule Hep B series, MMR per Philis Fendt.  This will be a FT appointment

## 2016-09-30 ENCOUNTER — Ambulatory Visit (INDEPENDENT_AMBULATORY_CARE_PROVIDER_SITE_OTHER): Payer: BC Managed Care – PPO | Admitting: Osteopathic Medicine

## 2016-09-30 DIAGNOSIS — Z23 Encounter for immunization: Secondary | ICD-10-CM

## 2016-09-30 NOTE — Progress Notes (Signed)
Reviewed previous telephone note from Deliah BostonMichael Clark PA-C and lab results/titers - no rubeola immunity or HepB immunity. Orders placed.

## 2016-10-31 ENCOUNTER — Ambulatory Visit (INDEPENDENT_AMBULATORY_CARE_PROVIDER_SITE_OTHER): Payer: BC Managed Care – PPO | Admitting: Physician Assistant

## 2016-10-31 DIAGNOSIS — Z23 Encounter for immunization: Secondary | ICD-10-CM

## 2017-01-10 NOTE — Progress Notes (Signed)
Tb screening 

## 2017-01-10 NOTE — Progress Notes (Signed)
Hep B #2 

## 2017-04-30 ENCOUNTER — Ambulatory Visit (INDEPENDENT_AMBULATORY_CARE_PROVIDER_SITE_OTHER): Payer: BC Managed Care – PPO | Admitting: Physician Assistant

## 2017-04-30 ENCOUNTER — Encounter: Payer: Self-pay | Admitting: Physician Assistant

## 2017-04-30 VITALS — BP 119/77 | HR 72 | Temp 98.4°F | Resp 16 | Ht 66.0 in | Wt 200.0 lb

## 2017-04-30 DIAGNOSIS — Z23 Encounter for immunization: Secondary | ICD-10-CM | POA: Diagnosis not present

## 2017-04-30 NOTE — Patient Instructions (Signed)
     IF you received an x-ray today, you will receive an invoice from [facility]. Please contact [facility] at [phone] with questions or concerns regarding your invoice.   IF you received labwork today, you will receive an invoice from [facility]. Please contact [facility] at [phone] with questions or concerns regarding your invoice.   Our billing staff will not be able to assist you with questions regarding bills from these companies.  You will be contacted with the lab results as soon as they are available. The fastest way to get your results is to activate your My Chart account. Instructions are located on the last page of this paperwork. If you have not heard from us regarding the results in 2 weeks, please contact this office.     

## 2017-04-30 NOTE — Progress Notes (Signed)
Hep B#3  Immunization History  Administered Date(s) Administered  . Hepatitis B, adult 09/30/2016, 10/31/2016  . Influenza-Unspecified 09/04/2016  . MMR 09/30/2016  . PPD Test 09/23/2016  . Tdap 09/05/2016

## 2017-05-09 ENCOUNTER — Ambulatory Visit (INDEPENDENT_AMBULATORY_CARE_PROVIDER_SITE_OTHER): Payer: BC Managed Care – PPO | Admitting: Family Medicine

## 2017-05-09 ENCOUNTER — Encounter: Payer: Self-pay | Admitting: Family Medicine

## 2017-05-09 VITALS — BP 124/88 | HR 78 | Temp 97.8°F | Resp 16 | Ht 66.0 in | Wt 201.2 lb

## 2017-05-09 DIAGNOSIS — K120 Recurrent oral aphthae: Secondary | ICD-10-CM

## 2017-05-09 MED ORDER — TRIAMCINOLONE ACETONIDE 0.1 % MT PSTE: PASTE | OROMUCOSAL | 1 refills | Status: AC

## 2017-05-09 NOTE — Patient Instructions (Addendum)
  Use the triamcinolone oral paste 3 or 4 times a day on the ulcer as discussed.  Take Tylenol 500 mg 2 pills 3 times daily and/or ibuprofen or Aleve if needed for pain.  Avoid direct oral contact with others until full healing  Should the ulcer continued to persist for more than 2-3 weeks please get rechecked.  Return as needed   IF you received an x-ray today, you will receive an invoice [facility]. Please contact [facility] at [phone] with questions or concerns regarding your invoice.   IF you received labwork today, you will receive an invoice from [facility]. Please contact [facility] at [phone] with questions or concerns regarding your invoice.   Our billing staff will not be able to assist you with questions regarding bills from these companies.  You will be contacted with the lab results as soon as they are available. The fastest way to get your results is to activate your My Chart account. Instructions are located on the last page of this paperwork. If you have not heard from us regarding the results in 2 weeks, please contact this office.

## 2017-05-09 NOTE — Progress Notes (Signed)
Patient ID: Mary Aguilar, female    DOB: 12/17/1980  Age: 36 y.o. MRN: [id number]  Chief Complaint  Patient presents with  . Sore Throat    States there is a blister/lesion in the back of her throat    Her wife who is pregnant has a history of fever blisters but no aphthous ulcers. Subjective:   Patient has had a sore in the back of her mouth for the last 3 days. She can see and also back there.  Current allergies, medications, problem list, past/family and social histories reviewed.  Objective:  BP 124/88   Pulse 78   Temp 97.8 F (36.6 C) (Oral)   Resp 16   Ht 5\' 6"  (1.676 m)   Wt 201 lb 3.2 oz (91.3 kg)   LMP 04/27/2017   SpO2 100%   BMI 32.47 kg/m   No acute distress. TMs normal. Throat has an ulcer on the back left soft palate, almost a centimeter in diameter. No cervical nodes. Chest clear. Heart regular without murmur.  Assessment & Plan:   Assessment: 1. Oral aphthous ulcer       Plan: Viral oral ulcer will be treated with Kenalog in [place]. See instructions.There appears to be no clear indication for using oral antiviral agents for these lesions.  No orders of the defined types were placed in this encounter.   No orders of the defined types were placed in this encounter.        Patient Instructions    Use the triamcinolone oral paste 3 or 4 times a day on the ulcer as discussed.  Take Tylenol 500 mg 2 pills 3 times daily and/or ibuprofen or Aleve if needed for pain.  Avoid direct oral contact with others until full healing  Should the ulcer continued to persist for more than 2-3 weeks please get rechecked.  Return as needed   IF you received an x-ray today, you will receive an invoice from [facility]. Please contact [facility] at [phone] with questions or concerns regarding your invoice.   IF you received labwork today, you will receive an invoice from [facility]. Please contact [facility] at [phone] with  questions or concerns regarding your invoice.   Our billing staff will not be able to assist you with questions regarding bills from these companies.  You will be contacted with the lab results as soon as they are available. The fastest way to get your results is to activate your My Chart account. Instructions are located on the last page of this paperwork. If you have not heard from us regarding the results in 2 weeks, please contact this office.        No Follow-up on file.   Glena Pharris, MD 05/09/2017

## 2017-07-09 ENCOUNTER — Other Ambulatory Visit: Payer: Self-pay | Admitting: Radiology
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
## 2019-12-25 ENCOUNTER — Ambulatory Visit: Payer: BC Managed Care – PPO | Attending: Family

## 2019-12-25 DIAGNOSIS — Z23 Encounter for immunization: Secondary | ICD-10-CM | POA: Insufficient documentation

## 2019-12-25 NOTE — Progress Notes (Signed)
   Covid-19 Vaccination Clinic  Name:  ISAMAR NAZIR    MRN: [id number] DOB: Dec 13, 1980  12/25/2019  Ms. Verley was observed post Covid-19 immunization for 15 minutes without incidence. She was provided with Vaccine Information Sheet and instruction to access the V-Safe system.   Ms. Esperanza was instructed to call 911 with any severe reactions post vaccine: Marland Kitchen Difficulty breathing  . Swelling of your face and throat  . A fast heartbeat  . A bad rash all over your body  . Dizziness and weakness    Immunizations Administered    Name Date Dose VIS Date Route   Moderna COVID-19 Vaccine 12/25/2019 11:22 AM 0.5 mL 09/30/2019 Intramuscular   Manufacturer: Moderna   Lot: [id number]   NDC: 42552-589-48

## 2020-02-03 ENCOUNTER — Ambulatory Visit: Payer: BC Managed Care – PPO | Attending: Family

## 2020-02-03 DIAGNOSIS — Z23 Encounter for immunization: Secondary | ICD-10-CM

## 2020-02-03 NOTE — Progress Notes (Signed)
   Covid-19 Vaccination Clinic  Name:  Mary Aguilar    MRN: [id number] DOB: 1981-05-10  02/03/2020  Mary Aguilar was observed post Covid-19 immunization for 15 minutes without incident. She was provided with Vaccine Information Sheet and instruction to access the V-Safe system.   Mary Aguilar was instructed to call 911 with any severe reactions post vaccine: Marland Kitchen Difficulty breathing  . Swelling of face and throat  . A fast heartbeat  . A bad rash all over body  . Dizziness and weakness   Immunizations Administered    Name Date Dose VIS Date Route   Moderna COVID-19 Vaccine 02/03/2020  1:03 PM 0.5 mL 09/30/2019 Intramuscular   Manufacturer: Moderna   Lot: [id number]   NDC: 54862-824-17

## 2023-03-26 ENCOUNTER — Ambulatory Visit
Admission: EM | Admit: 2023-03-26 | Discharge: 2023-03-26 | Disposition: A | Discharge status: Home / Self Care | Payer: BC Managed Care – PPO

## 2023-03-26 DIAGNOSIS — H65191 Other acute nonsuppurative otitis media, right ear: Secondary | ICD-10-CM

## 2023-03-26 DIAGNOSIS — J0100 Acute maxillary sinusitis, unspecified: Secondary | ICD-10-CM

## 2023-03-26 DIAGNOSIS — H938X3 Other specified disorders of ear, bilateral: Secondary | ICD-10-CM | POA: Diagnosis not present

## 2023-03-26 DIAGNOSIS — R051 Acute cough: Secondary | ICD-10-CM | POA: Diagnosis not present

## 2023-03-26 MED ORDER — AZITHROMYCIN 250 MG PO TABS: ORAL_TABLET | ORAL | 0 refills | Status: AC

## 2023-03-26 MED ORDER — PROMETHAZINE-DM 6.25-15 MG/5ML PO SYRP: 5.0000 mL | ORAL_SOLUTION | Freq: Four times a day (QID) | ORAL | 0 refills | Status: AC | PRN

## 2023-03-26 NOTE — ED Provider Notes (Signed)
[facility]    CSN: [id number] Arrival date & time: 03/26/23  1111      History   Chief Complaint Chief Complaint  Patient presents with   Sore Throat    Entered by patient    HPI Mary Aguilar is a 42 y.o. female.   HPI SINUSITIS Patient presents with concern for possible sinus infection. Onset: 4 days.  He suffers from chronic seasonal allergies and has ongoing nasal drainage and nasal congestion throughout the year.  Reports over the last 4 days she has had facial fullness, bilateral ear fullness, sore throat, post nasal drainage, nasal congestion, and productive cough.  This is a recurrent problem that patient reports occurring at least 1-2 times per year.  She has not taken any medication for current symptoms.  Patient has no history of COPD or asthma.  Patient is a non-smoker.  Remainder of Review of Systems negative except as noted in the HPI.    Past Medical History:  Diagnosis Date   Allergy    Anxiety    Asthma    Depression     There are no problems to display for this patient.   History reviewed. No pertinent surgical history.  OB History   No obstetric history on file.      Home Medications    Prior to Admission medications   Medication Sig Start Date End Date Taking? Authorizing Provider  azithromycin (ZITHROMAX) 250 MG tablet Take 2 tabs PO x 1 dose, then 1 tab PO QD x 4 days 03/26/23  Yes Bing Neighbors, NP  omeprazole (PRILOSEC) 20 MG capsule Take 20 mg by mouth daily.   Yes [provider]  promethazine-dextromethorphan (PROMETHAZINE-DM) 6.25-15 MG/5ML syrup Take 5 mLs by mouth 4 (four) times daily as needed for cough. 03/26/23  Yes Bing Neighbors, NP  traZODone (DESYREL) 100 MG tablet Take 100 mg by mouth at bedtime.   Yes [provider]  citalopram (CELEXA) 10 MG tablet TK 1 T PO QD 03/31/15   [provider]  DULoxetine (CYMBALTA) 60 MG capsule Take 60 mg by mouth daily.    [provider]   DULoxetine HCl (CYMBALTA PO) Take by mouth.    [provider]  traZODone (DESYREL) 50 MG tablet Take 50 mg by mouth at bedtime.    [provider]  triamcinolone (KENALOG) 0.1 % paste Use a small amount on the ulcer about 4 times daily over the next week or so until ulcer resolves. 05/09/17   Peyton Najjar, MD    Family History Family History  Problem Relation Age of Onset   Hypertension Mother    Hyperlipidemia Maternal Grandmother    Hypertension Maternal Grandmother    Cancer Maternal Grandfather    Heart disease Paternal Grandmother    Cancer Paternal Grandfather     Social History Social History   Tobacco Use   Smoking status: Former    Types: Cigarettes    Quit date: 04/30/2014    Years since quitting: 8.9   Smokeless tobacco: Never  Substance Use Topics   Alcohol use: Yes    Comment: couple drinks a day   Drug use: No     Allergies   Penicillins and Sulfa antibiotics   Review of Systems Review of Systems Pertinent negatives listed in HPI   Physical Exam Triage Vital Signs ED Triage Vitals  Enc Vitals Group     BP 03/26/23 1125 (!) 130/97     Pulse Rate  03/26/23 1125 79     Resp 03/26/23 1125 16     Temp 03/26/23 1125 98.7 F (37.1 C)     Temp Source 03/26/23 1125 Oral     SpO2 03/26/23 1125 98 %     Weight --      Height --      Head Circumference --      Peak Flow --      Pain Score 03/26/23 1129 2     Pain Loc --      Pain Edu? --      Excl. in GC? --    No data found.  Updated Vital Signs BP (!) 130/97 (BP Location: Left Arm)   Pulse 79   Temp 98.7 F (37.1 C) (Oral)   Resp 16   SpO2 98%   Visual Acuity Right Eye Distance:   Left Eye Distance:   Bilateral Distance:    Right Eye Near:   Left Eye Near:    Bilateral Near:     Physical Exam Vitals reviewed.  Constitutional:      Appearance: She is ill-appearing.  HENT:     Head: Normocephalic and atraumatic.     Right Ear: A middle ear effusion is present.      Nose: Congestion and rhinorrhea present.     Mouth/Throat:     Mouth: Mucous membranes are moist.     Pharynx: Posterior oropharyngeal erythema present. No pharyngeal swelling.     Tonsils: No tonsillar exudate.  Eyes:     Conjunctiva/sclera: Conjunctivae normal.  Cardiovascular:     Rate and Rhythm: Normal rate and regular rhythm.  Pulmonary:     Effort: Pulmonary effort is normal.     Breath sounds: Rhonchi present.  Lymphadenopathy:     Cervical: Cervical adenopathy present.  Skin:    General: Skin is warm.  Neurological:     General: No focal deficit present.     Mental Status: She is alert.      UC Treatments / Results  Labs (all labs ordered are listed, but only abnormal results are displayed) Labs Reviewed - No data to display  EKG   Radiology No results found.  Procedures Procedures (including critical care time)  Medications Ordered in UC Medications - No data to display  Initial Impression / Assessment and Plan / UC Course  I have reviewed the triage vital signs and the nursing notes.  Pertinent labs & imaging results that were available during my care of the patient were reviewed by me and considered in my medical decision making (see chart for details).    Continue to hydrate well with fluids.  Take medication as prescribed return for evaluation if symptoms worsen or do not improve with prescribed treatment. Final Clinical Impressions(s) / UC Diagnoses   Final diagnoses:  Acute non-recurrent maxillary sinusitis  Acute cough  Acute effusion of right ear  Ear fullness, bilateral   Discharge Instructions   None    ED Prescriptions     Medication Sig Dispense Auth. Provider   azithromycin (ZITHROMAX) 250 MG tablet Take 2 tabs PO x 1 dose, then 1 tab PO QD x 4 days 6 tablet Bing Neighbors, NP   promethazine-dextromethorphan (PROMETHAZINE-DM) 6.25-15 MG/5ML syrup Take 5 mLs by mouth 4 (four) times daily as needed for cough. 180 mL Bing Neighbors, NP      PDMP not reviewed this encounter.   Bing Neighbors, NP 03/27/23 2017

## 2023-03-26 NOTE — ED Triage Notes (Signed)
Pt presents to UC w/ c/o sore throat, ear pain, congested cough x4.

## 2024-08-21 ENCOUNTER — Other Ambulatory Visit (HOSPITAL_COMMUNITY): Payer: Self-pay

## 2024-08-21 MED ORDER — TRAZODONE HCL 100 MG PO TABS
100.0000 mg | ORAL_TABLET | Freq: Every day | ORAL | 3 refills | Status: AC
  Filled 2024-08-21 – 2024-09-10 (×3): qty 90, 90d supply, fill #0

## 2024-08-21 MED ORDER — DULOXETINE HCL 30 MG PO CPEP
30.0000 mg | ORAL_CAPSULE | Freq: Every day | ORAL | 3 refills | Status: AC
  Filled 2024-08-21 – 2024-09-10 (×3): qty 90, 90d supply, fill #0

## 2024-09-01 ENCOUNTER — Other Ambulatory Visit (HOSPITAL_COMMUNITY): Payer: Self-pay

## 2024-09-03 ENCOUNTER — Other Ambulatory Visit (HOSPITAL_COMMUNITY): Payer: Self-pay

## 2024-09-03 MED ORDER — DOXYCYCLINE HYCLATE 100 MG PO CAPS
100.0000 mg | ORAL_CAPSULE | Freq: Two times a day (BID) | ORAL | 0 refills | Status: AC
  Filled 2024-09-03: qty 14, 7d supply, fill #0

## 2024-09-10 ENCOUNTER — Other Ambulatory Visit (HOSPITAL_COMMUNITY): Payer: Self-pay
